# Patient Record
Sex: Male | Born: 1985 | Hispanic: No | Marital: Single | Smoking: Current every day smoker
Health system: Southern US, Community
[De-identification: ages and names within clinical notes are randomized; demographics above are authoritative.]

---
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
## 2009-06-25 ENCOUNTER — Emergency Department: Payer: Self-pay | Admitting: Emergency Medicine

## 2009-09-09 ENCOUNTER — Emergency Department: Payer: Self-pay | Admitting: Emergency Medicine

## 2016-04-05 ENCOUNTER — Emergency Department
Admission: EM | Admit: 2016-04-05 | Discharge: 2016-04-05 | Attending: Emergency Medicine | Admitting: Emergency Medicine

## 2016-04-05 ENCOUNTER — Emergency Department

## 2016-04-05 ENCOUNTER — Other Ambulatory Visit
Admission: RE | Admit: 2016-04-05 | Discharge: 2016-04-05 | Disposition: A | Discharge status: Home / Self Care | Attending: Family Medicine | Admitting: Family Medicine

## 2016-04-05 ENCOUNTER — Other Ambulatory Visit: Payer: Self-pay

## 2016-04-05 ENCOUNTER — Encounter: Payer: Self-pay | Admitting: Emergency Medicine

## 2016-04-05 DIAGNOSIS — F17200 Nicotine dependence, unspecified, uncomplicated: Secondary | ICD-10-CM | POA: Diagnosis not present

## 2016-04-05 DIAGNOSIS — F14121 Cocaine abuse with intoxication with delirium: Secondary | ICD-10-CM | POA: Diagnosis not present

## 2016-04-05 DIAGNOSIS — F14921 Cocaine use, unspecified with intoxication delirium: Secondary | ICD-10-CM

## 2016-04-05 DIAGNOSIS — R451 Restlessness and agitation: Secondary | ICD-10-CM | POA: Diagnosis present

## 2016-04-05 LAB — COMPREHENSIVE METABOLIC PANEL
ALBUMIN: 5 g/dL (ref 3.5–5.0)
ALT: 34 U/L (ref 17–63)
AST: 28 U/L (ref 15–41)
Alkaline Phosphatase: 63 U/L (ref 38–126)
Anion gap: 12 (ref 5–15)
BUN: 13 mg/dL (ref 6–20)
CO2: 20 mmol/L — AB (ref 22–32)
Calcium: 9.5 mg/dL (ref 8.9–10.3)
Chloride: 108 mmol/L (ref 101–111)
Creatinine, Ser: 0.76 mg/dL (ref 0.61–1.24)
GFR calc non Af Amer: >60 mL/min (ref >60)
GLUCOSE: 97 mg/dL (ref 65–99)
POTASSIUM: 3.5 mmol/L (ref 3.5–5.1)
SODIUM: 140 mmol/L (ref 135–145)
Total Bilirubin: 0.5 mg/dL (ref 0.3–1.2)
Total Protein: 8 g/dL (ref 6.5–8.1)

## 2016-04-05 LAB — URINE DRUG SCREEN, QUALITATIVE (ARMC ONLY)
AMPHETAMINES, UR SCREEN: NOT DETECTED
BENZODIAZEPINE, UR SCRN: NOT DETECTED
Barbiturates, Ur Screen: NOT DETECTED
COCAINE METABOLITE, UR ~~LOC~~: NOT DETECTED
Cannabinoid 50 Ng, Ur ~~LOC~~: POSITIVE — AB
MDMA (Ecstasy)Ur Screen: NOT DETECTED
METHADONE SCREEN, URINE: NOT DETECTED
OPIATE, UR SCREEN: NOT DETECTED
Phencyclidine (PCP) Ur S: NOT DETECTED
TRICYCLIC, UR SCREEN: NOT DETECTED

## 2016-04-05 LAB — CBC
HCT: 47.3 % (ref 40.0–52.0)
Hemoglobin: 16.7 g/dL (ref 13.0–18.0)
MCH: 33.5 pg (ref 26.0–34.0)
MCHC: 35.3 g/dL (ref 32.0–36.0)
MCV: 94.8 fL (ref 80.0–100.0)
PLATELETS: 216 10*3/uL (ref 150–440)
RBC: 4.99 MIL/uL (ref 4.40–5.90)
RDW: 12.6 % (ref 11.5–14.5)
WBC: 9.7 10*3/uL (ref 3.8–10.6)

## 2016-04-05 LAB — CK: CK TOTAL: 145 U/L (ref 49–397)

## 2016-04-05 LAB — TROPONIN I: Troponin I: <0.03 ng/mL (ref <0.031)

## 2016-04-05 MED ORDER — LORAZEPAM 2 MG/ML IJ SOLN
1.0000 mg | Freq: Once | INTRAMUSCULAR | Status: AC
  Administered 2016-04-05: 1 mg via INTRAVENOUS

## 2016-04-05 MED ORDER — SODIUM CHLORIDE 0.9 % IV BOLUS (SEPSIS)
1000.0000 mL | Freq: Once | INTRAVENOUS | Status: AC
  Administered 2016-04-05: 1000 mL via INTRAVENOUS

## 2016-04-05 MED ORDER — LORAZEPAM 2 MG/ML IJ SOLN
INTRAMUSCULAR | Status: AC
  Administered 2016-04-05: 1 mg via INTRAVENOUS
  Filled 2016-04-05: qty 1

## 2016-04-05 NOTE — ED Notes (Signed)
Pt presents to ER accompanied by [employer] with complaints of cocaine ingestion. Pt reports he swallowed cocaine when he was stopped by police. Pt being loud, upon arrival to ER was verbally aggressive toward officers and staff, pt then apologized and reports he swallowed cocaine and was not feeling right. Pt talking in complete sentences no respiratory distress noted.

## 2016-04-05 NOTE — ED Notes (Addendum)
Blood draw done for [employer], right AC.

## 2016-04-05 NOTE — ED Provider Notes (Signed)
[facility] Emergency Department Provider Note  ____________________________________________  Time seen: 4:20 AM  I have reviewed the triage vital signs and the nursing notes.   HISTORY  Chief Complaint Ingestion and Agitation     HPI Dustin Harper is a 30 y.o. male [place] [employer] stating that he ingested cocaine when he was stopped by the police to patient admits to swallowing cocaine when he was stopped by the police officers. Patient very agitated at this time.  Past medical history None There are no active problems to display for this patient.   Surgical history No pertinent past surgical history No current outpatient prescriptions on file.  Allergies No known drug allergies History reviewed. No pertinent family history.  Social History Social History  Substance Use Topics  . Smoking status: Current Every Day Smoker  . Smokeless tobacco: None  . Alcohol Use: Yes    Review of Systems  Constitutional: Negative for fever. Eyes: Negative for visual changes. ENT: Negative for sore throat. Cardiovascular: Negative for chest pain. Respiratory: Negative for shortness of breath. Gastrointestinal: Negative for abdominal pain, vomiting and diarrhea. Genitourinary: Negative for dysuria. Musculoskeletal: Negative for back pain. Skin: Negative for rash. Neurological: Negative for headaches, focal weakness or numbness. Psychiatric:Positive for cocaine ingestion  10-point ROS otherwise negative.  ____________________________________________   PHYSICAL EXAM:  VITAL SIGNS: ED Triage Vitals  Enc Vitals Group     BP 04/05/16 0416 143/128 mmHg     Pulse Rate 04/05/16 0416 104     Resp 04/05/16 0416 24     Temp 04/05/16 0416 98.1 F (36.7 C)     Temp Source 04/05/16 0416 Oral     SpO2 04/05/16 0416 95 %     Weight 04/05/16 0416 200 lb (90.719 kg)     Height 04/05/16 0416 6\' 2"  (1.88 m)     Head Cir --    Peak Flow --      Pain Score --      Pain Loc --      Pain Edu? --      Excl. in GC? --     Constitutional: Alert and oriented. Well appearing and in no distress. Eyes: Conjunctivae are normal. PERRL. Normal extraocular movements. ENT   Head: Normocephalic and atraumatic.   Nose: No congestion/rhinnorhea.   Mouth/Throat: Mucous membranes are moist.   Neck: No stridor. Hematological/Lymphatic/Immunilogical: No cervical lymphadenopathy. Cardiovascular: Normal rate, regular rhythm. Normal and symmetric distal pulses are present in all extremities. No murmurs, rubs, or gallops. Respiratory: Normal respiratory effort without tachypnea nor retractions. Breath sounds are clear and equal bilaterally. No wheezes/rales/rhonchi. Gastrointestinal: Soft and nontender. No distention. There is no CVA tenderness. Genitourinary: deferred Musculoskeletal: Nontender with normal range of motion in all extremities. No joint effusions.  No lower extremity tenderness nor edema. Neurologic:  Normal speech and language. No gross focal neurologic deficits are appreciated. Speech is normal.  Skin:  Skin is warm, dry and intact. No rash noted. Psychiatric: Mood and affect are normal. Speech and behavior are normal. Patient exhibits appropriate insight and judgment.  ____________________________________________    LABS (pertinent positives/negatives)  Labs Reviewed  COMPREHENSIVE METABOLIC PANEL - Abnormal; Notable for the following:    CO2 20 (*)    All other components within normal limits  URINE DRUG SCREEN, QUALITATIVE ([facility] ONLY) - Abnormal; Notable for the following:    Cannabinoid 50 Ng, Ur [place] POSITIVE (*)    All other components within normal limits  CK  CBC  TROPONIN I     ____________________________________________   EKG  ED ECG REPORT I, [place] Benno Brensinger, the attending physician, personally viewed and interpreted this ECG.   Date: 04/05/2016  EKG Time: 4:22 AM  Rate:  117  Rhythm: Sinus tachycardia  Axis: Normal  Intervals: Normal  ST&T Change: None   ____________________________________________    RADIOLOGY  DG Abd 1 View (Final result) Result time: 04/05/16 05:01:04   Final result by Rad Results In Interface (04/05/16 05:01:04)   Narrative:   CLINICAL DATA: Swallowed cocaine when stopped by police.  EXAM: ABDOMEN - 1 VIEW  COMPARISON: None.  FINDINGS: The bowel gas pattern is normal. No radio-opaque calculi or other significant radiographic abnormality are seen.  IMPRESSION: Negative.   Electronically Signed By: Awilda Metro M.D. On: 04/05/2016 05:01       INITIAL IMPRESSION / ASSESSMENT AND PLAN / ED COURSE  Pertinent labs & imaging results that were available during my care of the patient were reviewed by me and considered in my medical decision making (see chart for details). Patient received 1 milligram of IV Ativan with resolution of symptoms  ____________________________________________   FINAL CLINICAL IMPRESSION(S) / ED DIAGNOSES  Final diagnoses:  Cocaine intoxication, with delirium (HCC)      Darci Current, MD 04/05/16 [phone]

## 2016-04-05 NOTE — Discharge Instructions (Signed)
Stimulant Use Disorder-Cocaine  Cocaine is one of a group of powerful drugs called stimulants. Cocaine has medical uses for stopping nosebleeds and for pain control before minor nose or dental surgery. However, cocaine is misused because of the effects that it produces. These effects include:   · A feeling of extreme pleasure.  · Alertness.  · High energy.  Common street names for cocaine include coke, crack, blow, snow, and nose candy. Cocaine is snorted, dissolved in water and injected, or smoked.   Stimulants are addictive because they activate regions of the brain that produce both the pleasurable sensation of "reward" and psychological dependence. Together, these actions account for loss of control and the rapid development of drug dependence. This means you become ill without the drug (withdrawal) and need to keep using it to function.   Stimulant use disorder is use of stimulants that disrupts your daily life. It disrupts relationships with family and friends and how you do your job. Cocaine increases your blood pressure and heart rate. It can cause a heart attack or stroke. Cocaine can also cause death from irregular heart rate or seizures.  SYMPTOMS  Symptoms of stimulant use disorder with cocaine include:  · Use of cocaine in larger amounts or over a longer period of time than intended.  · Unsuccessful attempts to cut down or control cocaine use.  · A lot of time spent obtaining, using, or recovering from the effects of cocaine.  · A strong desire or urge to use cocaine (craving).  · Continued use of cocaine in spite of major problems at work, school, or home because of use.  · Continued use of cocaine in spite of relationship problems because of use.  · Giving up or cutting down on important life activities because of cocaine use.  · Use of cocaine over and over in situations when it is physically hazardous, such as driving a car.  · Continued use of cocaine in spite of a physical problem that is likely  related to use. Physical problems can include:  ¨ Malnutrition.  ¨ Nosebleeds.  ¨ Chest pain.  ¨ High blood pressure.  ¨ A hole that develops between the part of your nose that separates your nostrils (perforated nasal septum).  ¨ Lung and kidney damage.  · Continued use of cocaine in spite of a mental problem that is likely related to use. Mental problems can include:  ¨ Schizophrenia-like symptoms.  ¨ Depression.  ¨ Bipolar mood swings.  ¨ Anxiety.  ¨ Sleep problems.  · Need to use more and more cocaine to get the same effect, or lessened effect over time with use of the same amount of cocaine (tolerance).  · Having withdrawal symptoms when cocaine use is stopped, or using cocaine to reduce or avoid withdrawal symptoms. Withdrawal symptoms include:  ¨ Depressed or irritable mood.  ¨ Low energy or restlessness.  ¨ Bad dreams.  ¨ Poor or excessive sleep.  ¨ Increased appetite.  DIAGNOSIS  Stimulant use disorder is diagnosed by your health care provider. You may be asked questions about your cocaine use and how it affects your life. A physical exam may be done. A drug screen may be ordered. You may be referred to a mental health professional. The diagnosis of stimulant use disorder requires at least two symptoms within 12 months. The type of stimulant use disorder depends on the number of signs and symptoms you have. The type may be:  · Mild. Two or three signs and symptoms.  ·   Moderate. Four or five signs and symptoms.  · Severe. Six or more signs and symptoms.  TREATMENT  Treatment for stimulant use disorder is usually provided by mental health professionals with training in substance use disorders. The following options are available:  · Counseling or talk therapy. Talk therapy addresses the reasons you use cocaine and ways to keep you from using again. Goals of talk therapy include:  ¨ Identifying and avoiding triggers for use.  ¨ Handling cravings.  ¨ Replacing use with healthy activities.  · Support groups.  Support groups provide emotional support, advice, and guidance.  · Medicine. Certain medicines may decrease cocaine cravings or withdrawal symptoms.  HOME CARE INSTRUCTIONS  · Take medicines only as directed by your health care provider.  · Identify the people and activities that trigger your cocaine use and avoid them.  · Keep all follow-up visits as directed by your health care provider.  SEEK MEDICAL CARE IF:  · Your symptoms get worse or you relapse.  · You are not able to take medicines as directed.  SEEK IMMEDIATE MEDICAL CARE IF:  · You have serious thoughts about hurting yourself or others.  · You have a seizure, chest pain, sudden weakness, or loss of speech or vision.  FOR MORE INFORMATION  · National Institute on Drug Abuse: www.drugabuse.gov  · Substance Abuse and Mental Health Services Administration: www.samhsa.gov     This information is not intended to replace advice given to you by your health care provider. Make sure you discuss any questions you have with your health care provider.     Document Released: 11/30/2000 Document Revised: 12/24/2014 Document Reviewed: 12/16/2013  Elsevier Interactive Patient Education ©2016 [employer] Inc.

## 2016-04-05 NOTE — ED Notes (Addendum)
Pt states he ingested pure cocaine. Pt very loud. Tachycardic, trying to make himself throw up by sticking his fingers in his fingers down his throat. Pt stating "I am fucked up, please don't stick nothing in me, I have had no alcohol." Pt very agitated.   Dr. Manson PasseyBrown at bedside. [employer] at bedside.

## 2016-04-05 NOTE — ED Notes (Signed)
Pt transported to x-ray via stretcher with [employer].

## 2016-04-05 NOTE — ED Notes (Signed)
Pt refused to tell this RN his birthday when RN placed ID bracelet on wrist. Pt states "you can figure out my birthday."

## 2016-04-05 NOTE — ED Notes (Signed)
Pt discharged into custody of [employer].

## 2016-10-05 IMAGING — CR DG ABDOMEN 1V
1 series · 2 of 2 positions shown · non-contrast
Comparison: None.

CLINICAL DATA: Swallowed cocaine when stopped by police.

EXAM:
ABDOMEN - 1 VIEW

[Series 1: t abdomen supine · 0.14mm/px · 2 of 2 slices shown]
[im 1/2]
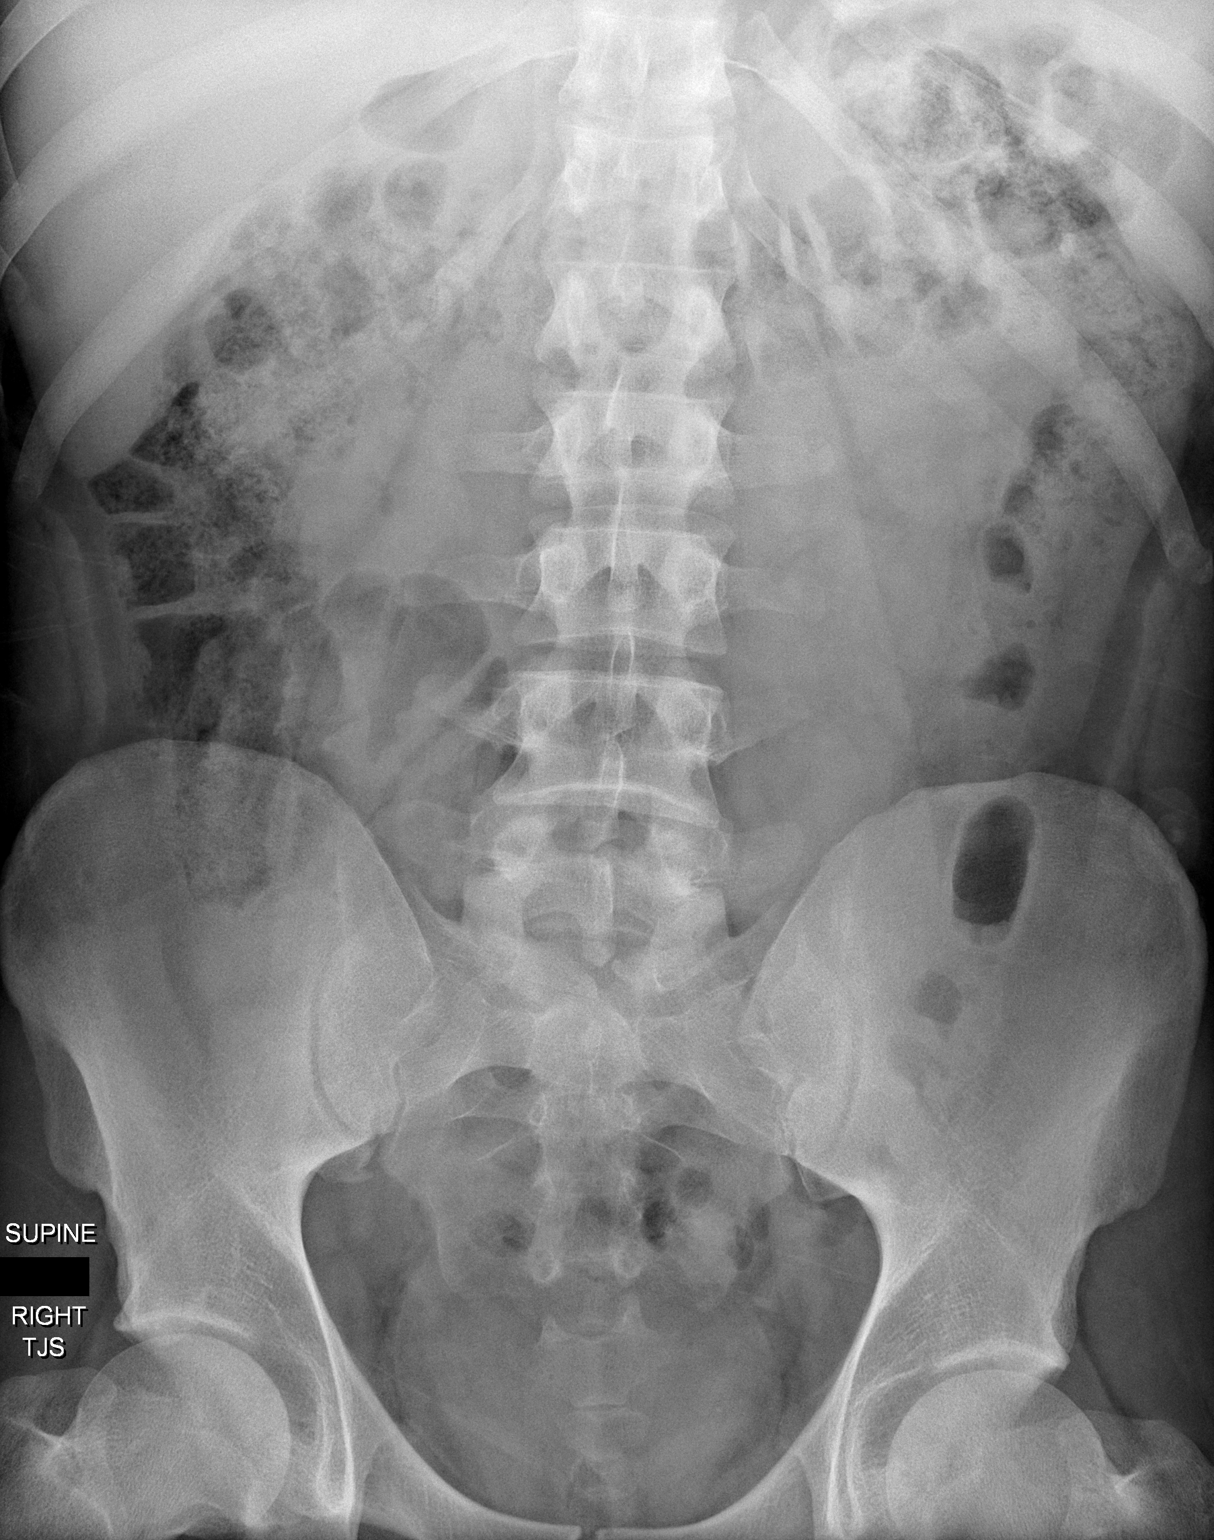
[im 2/2]
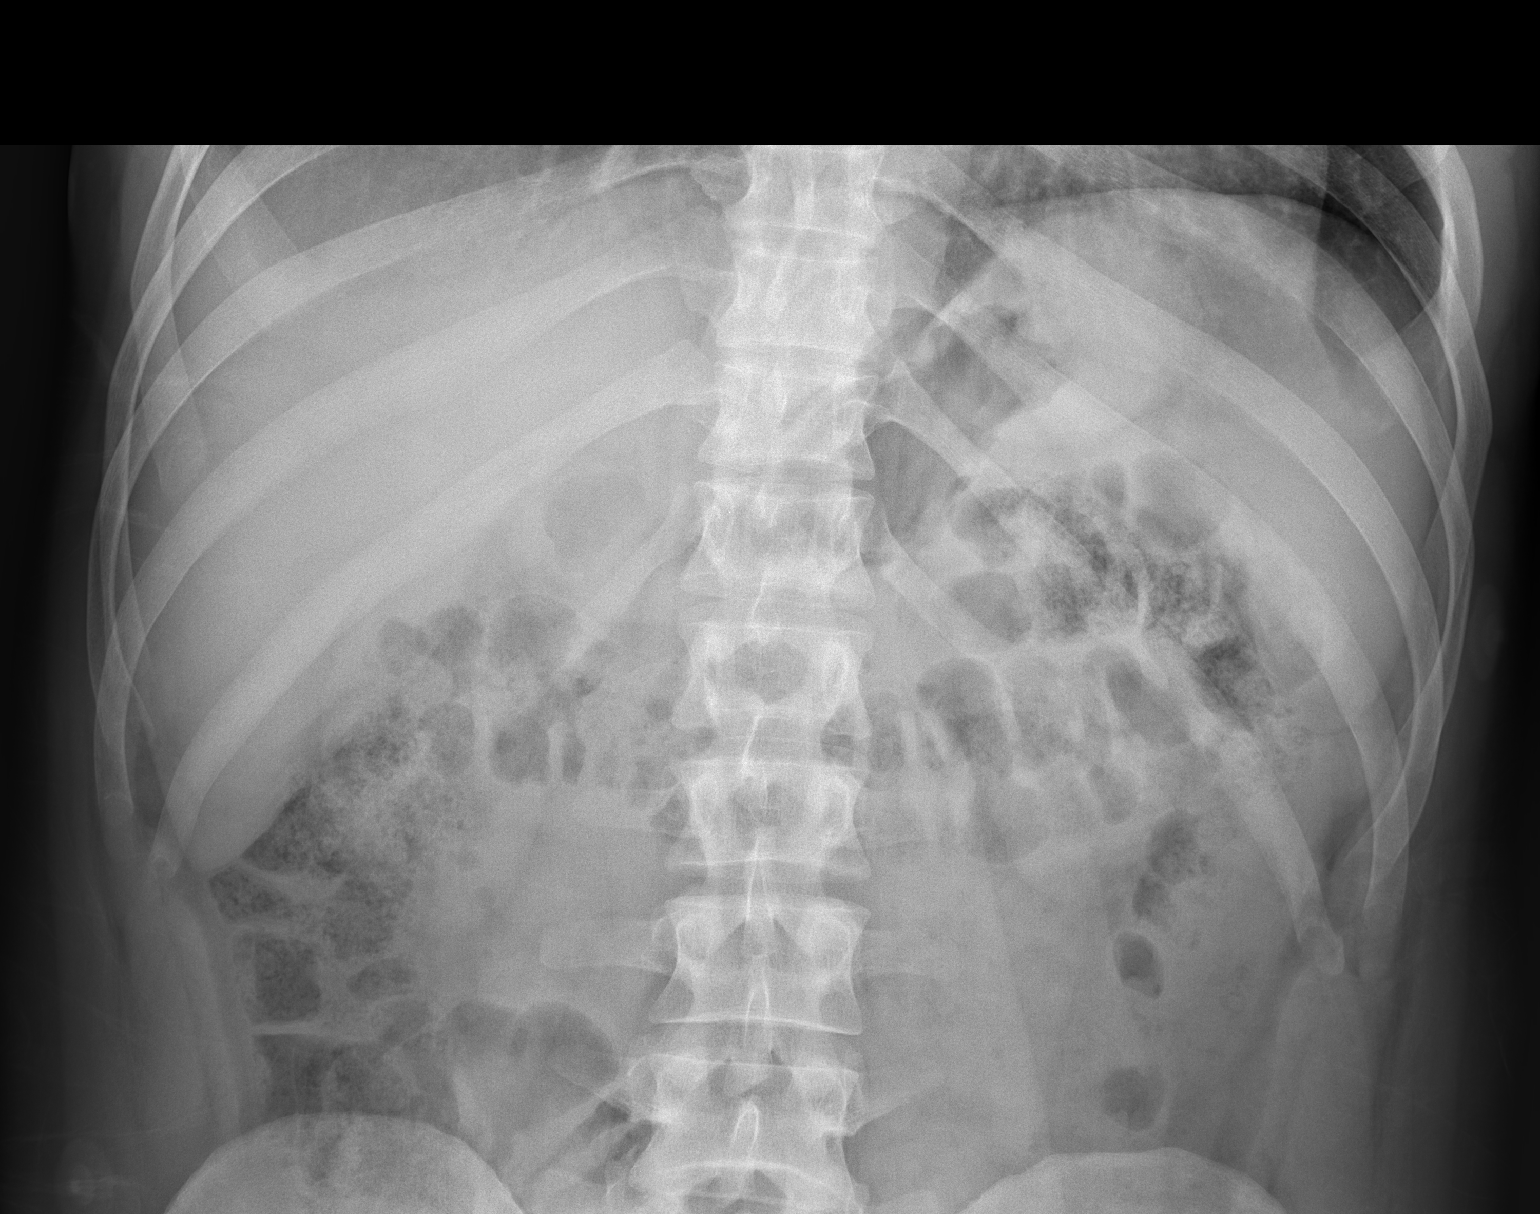

[2 of 2 positions shown; findings below may reference images not displayed]

FINDINGS: The bowel gas pattern is normal. No radio-opaque calculi or other
significant radiographic abnormality are seen.
IMPRESSION: Negative.

## 2023-01-11 ENCOUNTER — Emergency Department
Admission: EM | Admit: 2023-01-11 | Discharge: 2023-01-11 | Disposition: A | Discharge status: Home / Self Care | Payer: Medicaid Other | Attending: Emergency Medicine | Admitting: Emergency Medicine

## 2023-01-11 ENCOUNTER — Other Ambulatory Visit: Payer: Self-pay

## 2023-01-11 DIAGNOSIS — J029 Acute pharyngitis, unspecified: Secondary | ICD-10-CM | POA: Diagnosis present

## 2023-01-11 DIAGNOSIS — J020 Streptococcal pharyngitis: Secondary | ICD-10-CM | POA: Diagnosis not present

## 2023-01-11 LAB — CBC
HCT: 45.6 % (ref 39.0–52.0)
Hemoglobin: 15.8 g/dL (ref 13.0–17.0)
MCH: 33.3 pg (ref 26.0–34.0)
MCHC: 34.6 g/dL (ref 30.0–36.0)
MCV: 96 fL (ref 80.0–100.0)
Platelets: 175 10*3/uL (ref 150–400)
RBC: 4.75 MIL/uL (ref 4.22–5.81)
RDW: 11.6 % (ref 11.5–15.5)
WBC: 16.5 10*3/uL — ABNORMAL HIGH (ref 4.0–10.5)
nRBC: 0 % (ref 0.0–0.2)

## 2023-01-11 LAB — GROUP A STREP BY PCR: Group A Strep by PCR: DETECTED — AB

## 2023-01-11 LAB — BASIC METABOLIC PANEL
Anion gap: 8 (ref 5–15)
BUN: 8 mg/dL (ref 6–20)
CO2: 26 mmol/L (ref 22–32)
Calcium: 8.8 mg/dL — ABNORMAL LOW (ref 8.9–10.3)
Chloride: 101 mmol/L (ref 98–111)
Creatinine, Ser: 0.78 mg/dL (ref 0.61–1.24)
GFR, Estimated: >60 mL/min (ref >60)
Glucose, Bld: 107 mg/dL — ABNORMAL HIGH (ref 70–99)
Potassium: 3.6 mmol/L (ref 3.5–5.1)
Sodium: 135 mmol/L (ref 135–145)

## 2023-01-11 MED ORDER — PENICILLIN G BENZATHINE 1200000 UNIT/2ML IM SUSY
1.2000 10*6.[IU] | PREFILLED_SYRINGE | Freq: Once | INTRAMUSCULAR | Status: AC
  Administered 2023-01-11: 1.2 10*6.[IU] via INTRAMUSCULAR
  Filled 2023-01-11: qty 2

## 2023-01-11 MED ORDER — PREDNISONE 10 MG PO TABS: 10.0000 mg | ORAL_TABLET | Freq: Every day | ORAL | 0 refills | Status: AC

## 2023-01-11 MED ORDER — KETOROLAC TROMETHAMINE 60 MG/2ML IM SOLN
30.0000 mg | Freq: Once | INTRAMUSCULAR | Status: AC
  Administered 2023-01-11: 30 mg via INTRAMUSCULAR
  Filled 2023-01-11: qty 2

## 2023-01-11 MED ORDER — DEXAMETHASONE SODIUM PHOSPHATE 10 MG/ML IJ SOLN
10.0000 mg | Freq: Once | INTRAMUSCULAR | Status: AC
  Administered 2023-01-11: 10 mg via INTRAMUSCULAR
  Filled 2023-01-11: qty 1

## 2023-01-11 MED ORDER — HYDROCODONE-ACETAMINOPHEN 5-325 MG PO TABS
1.0000 | ORAL_TABLET | ORAL | Status: AC
  Administered 2023-01-11: 1 via ORAL
  Filled 2023-01-11: qty 1

## 2023-01-11 NOTE — ED Triage Notes (Signed)
Pt to ED from home. Feels like he has something stuck in his throat that started last night. Pt in lobby raising his voice at registration. Pt is CAOx4. Pt ambulatory. Pt states "I can't breath".

## 2023-01-11 NOTE — ED Provider Notes (Signed)
[facility] Provider Note   CSN: [id number] Arrival date & time: 01/11/23  1924     History  Chief Complaint  Patient presents with   Sore Throat   Shortness of Breath    [name] is a 37 y.o. male.  Presents to the emergency department for evaluation of sore throat.  Patient states that he has had 24 hours of severe sore throat.  Has extreme pain with swallowing.  Is able to swallow fluids and oral medication.  He denies any coughing, chest pain or shortness of breath.  No known fevers.  No nausea vomiting or abdominal pain.  No headaches or rashes  HPI     Home Medications Prior to Admission medications   Medication Sig Start Date End Date Taking? Authorizing Provider  predniSONE (DELTASONE) 10 MG tablet Take 1 tablet (10 mg total) by mouth daily. 6,5,4,3,2,1 six day taper 01/11/23  Yes Duanne Guess, PA-C      Allergies    Patient has no known allergies.    Review of Systems   Review of Systems  Physical Exam Updated Vital Signs BP (!) 132/90   Pulse 90   Temp 98.9 F (37.2 C) (Oral)   Resp 16   Ht 6\' 4"  (1.93 m)   Wt 106.6 kg   SpO2 100%   BMI 28.61 kg/m  Physical Exam Constitutional:      Appearance: He is well-developed.  HENT:     Head: Normocephalic and atraumatic.     Mouth/Throat:     Mouth: Mucous membranes are moist.     Pharynx: Uvula midline. Oropharyngeal exudate present. No pharyngeal swelling, posterior oropharyngeal erythema or uvula swelling.     Tonsils: Tonsillar exudate present. No tonsillar abscesses.     Comments: No trismus Eyes:     Conjunctiva/sclera: Conjunctivae normal.  Cardiovascular:     Rate and Rhythm: Normal rate.  Pulmonary:     Effort: Pulmonary effort is normal. No respiratory distress.     Breath sounds: No wheezing or rales.  Musculoskeletal:        General: Normal range of motion.     Cervical back: Normal range of motion and neck supple.  Lymphadenopathy:      Cervical: No cervical adenopathy.  Skin:    General: Skin is warm.     Findings: No rash.  Neurological:     Mental Status: He is alert and oriented to person, place, and time.  Psychiatric:        Behavior: Behavior normal.        Thought Content: Thought content normal.     ED Results / Procedures / Treatments   Labs (all labs ordered are listed, but only abnormal results are displayed) Labs Reviewed  GROUP A STREP BY PCR - Abnormal; Notable for the following components:      Result Value   Group A Strep by PCR DETECTED (*)    All other components within normal limits  BASIC METABOLIC PANEL - Abnormal; Notable for the following components:   Glucose, Bld 107 (*)    Calcium 8.8 (*)    All other components within normal limits  CBC - Abnormal; Notable for the following components:   WBC 16.5 (*)    All other components within normal limits    EKG None  Radiology No results found.  Procedures Procedures    Medications Ordered in ED Medications  dexamethasone (DECADRON) injection 10 mg (10 mg Intramuscular  Given 01/11/23 2144)  ketorolac (TORADOL) injection 30 mg (30 mg Intramuscular Given 01/11/23 2143)  HYDROcodone-acetaminophen (NORCO/VICODIN) 5-325 MG per tablet 1 tablet (1 tablet Oral Given 01/11/23 2143)  penicillin g benzathine (BICILLIN LA) 1200000 UNIT/2ML injection 1.2 Million Units (1.2 Million Units Intramuscular Given 01/11/23 2144)    ED Course/ Medical Decision Making/ A&P                             Medical Decision Making Amount and/or Complexity of Data Reviewed Labs: ordered.  Risk Prescription drug management.   37 year old male with severe sore throat.  He strep positive.  Vital signs and labs are stable.  Patient able to tolerate p.o. fluids.  We discussed treatment and he elected to proceed with one-time IM dose of penicillin 1,200,000 units.  Due to pain we also gave him one-time dose of Norco p.o. along with IM dexamethasone and IM  Toradol.  Patient feeling significantly better after medications and feeling better and stable and ready for discharge to home.  He understands to return for any difficulty swallowing, fevers or any urgent changes in his health.   Final Clinical Impression(s) / ED Diagnoses Final diagnoses:  Strep pharyngitis    Rx / DC Orders ED Discharge Orders          Ordered    predniSONE (DELTASONE) 10 MG tablet  Daily        01/11/23 2209              Renata Caprice 01/11/23 2217    Lavonia Drafts, MD 01/14/23 [phone]

## 2023-01-11 NOTE — Discharge Instructions (Signed)
Take Tylenol 1000 mg every 6 hours as needed for pain.  Take prednisone as prescribed for 6 days.  Make sure you are drinking lots of fluids.  Return to the ER for any difficulty swallowing, fevers, worsening symptoms or urgent changes in your health
